# Patient Record
Sex: Male | Born: 1965 | Race: Black or African American | Hispanic: No | Marital: Single | Smoking: Current every day smoker
Health system: Southern US, Community
[De-identification: ages and names within clinical notes are randomized; demographics above are authoritative.]

## PROBLEM LIST (undated history)

## (undated) DIAGNOSIS — M1990 Unspecified osteoarthritis, unspecified site: Secondary | ICD-10-CM

## (undated) DIAGNOSIS — M545 Low back pain, unspecified: Secondary | ICD-10-CM

## (undated) HISTORY — DX: Low back pain, unspecified: M54.50

## (undated) HISTORY — PX: SPINE SURGERY: SHX786

## (undated) HISTORY — DX: Low back pain: M54.5

## (undated) HISTORY — PX: NECK SURGERY: SHX720

## (undated) HISTORY — DX: Unspecified osteoarthritis, unspecified site: M19.90

---

## 1998-05-30 ENCOUNTER — Ambulatory Visit (HOSPITAL_COMMUNITY): Admission: RE | Admit: 1998-05-30 | Discharge: 1998-05-30 | Payer: Self-pay | Admitting: Neurosurgery

## 1998-05-30 ENCOUNTER — Encounter: Payer: Self-pay | Admitting: Neurosurgery

## 1998-06-03 ENCOUNTER — Ambulatory Visit (HOSPITAL_COMMUNITY): Admission: RE | Admit: 1998-06-03 | Discharge: 1998-06-03 | Payer: Self-pay | Admitting: Neurosurgery

## 1998-06-03 ENCOUNTER — Encounter: Payer: Self-pay | Admitting: Neurosurgery

## 1998-06-22 ENCOUNTER — Encounter: Payer: Self-pay | Admitting: Neurosurgery

## 1998-06-22 ENCOUNTER — Ambulatory Visit (HOSPITAL_COMMUNITY): Admission: RE | Admit: 1998-06-22 | Discharge: 1998-06-23 | Payer: Self-pay | Admitting: Neurosurgery

## 1998-07-14 ENCOUNTER — Ambulatory Visit (HOSPITAL_COMMUNITY): Admission: RE | Admit: 1998-07-14 | Discharge: 1998-07-14 | Payer: Self-pay | Admitting: Neurosurgery

## 1998-07-14 ENCOUNTER — Encounter: Payer: Self-pay | Admitting: Neurosurgery

## 1998-08-04 ENCOUNTER — Ambulatory Visit (HOSPITAL_COMMUNITY): Admission: RE | Admit: 1998-08-04 | Discharge: 1998-08-04 | Payer: Self-pay | Admitting: Neurosurgery

## 1998-08-04 ENCOUNTER — Encounter: Payer: Self-pay | Admitting: Neurosurgery

## 1998-08-21 ENCOUNTER — Encounter: Payer: Self-pay | Admitting: Neurosurgery

## 1998-08-21 ENCOUNTER — Ambulatory Visit (HOSPITAL_COMMUNITY): Admission: RE | Admit: 1998-08-21 | Discharge: 1998-08-21 | Payer: Self-pay | Admitting: Neurosurgery

## 1999-08-02 ENCOUNTER — Encounter: Payer: Self-pay | Admitting: Neurosurgery

## 1999-08-02 ENCOUNTER — Encounter: Admission: RE | Admit: 1999-08-02 | Discharge: 1999-08-02 | Payer: Self-pay | Admitting: Neurosurgery

## 1999-11-07 ENCOUNTER — Encounter: Payer: Self-pay | Admitting: Neurosurgery

## 1999-11-07 ENCOUNTER — Inpatient Hospital Stay (HOSPITAL_COMMUNITY): Admission: RE | Admit: 1999-11-07 | Discharge: 1999-11-08 | Payer: Self-pay | Admitting: Neurosurgery

## 1999-11-16 ENCOUNTER — Encounter: Payer: Self-pay | Admitting: Neurosurgery

## 1999-11-16 ENCOUNTER — Encounter: Admission: RE | Admit: 1999-11-16 | Discharge: 1999-11-16 | Payer: Self-pay | Admitting: Neurosurgery

## 2000-01-03 ENCOUNTER — Encounter: Payer: Self-pay | Admitting: Neurosurgery

## 2000-01-03 ENCOUNTER — Encounter: Admission: RE | Admit: 2000-01-03 | Discharge: 2000-01-03 | Payer: Self-pay | Admitting: Neurosurgery

## 2000-02-21 ENCOUNTER — Encounter: Payer: Self-pay | Admitting: Neurosurgery

## 2000-02-21 ENCOUNTER — Encounter: Admission: RE | Admit: 2000-02-21 | Discharge: 2000-02-21 | Payer: Self-pay | Admitting: Neurosurgery

## 2000-05-15 ENCOUNTER — Encounter: Admission: RE | Admit: 2000-05-15 | Discharge: 2000-08-13 | Payer: Self-pay | Admitting: Neurosurgery

## 2000-06-24 ENCOUNTER — Encounter: Payer: Self-pay | Admitting: Neurosurgery

## 2000-06-24 ENCOUNTER — Encounter: Admission: RE | Admit: 2000-06-24 | Discharge: 2000-06-24 | Payer: Self-pay | Admitting: Neurosurgery

## 2001-12-07 ENCOUNTER — Ambulatory Visit (HOSPITAL_COMMUNITY): Admission: RE | Admit: 2001-12-07 | Discharge: 2001-12-07 | Payer: Self-pay | Admitting: Neurosurgery

## 2001-12-07 ENCOUNTER — Encounter: Payer: Self-pay | Admitting: Neurosurgery

## 2009-12-15 ENCOUNTER — Inpatient Hospital Stay (HOSPITAL_COMMUNITY): Admission: RE | Admit: 2009-12-15 | Discharge: 2009-12-18 | Payer: Self-pay | Admitting: *Deleted

## 2010-03-28 LAB — COMPREHENSIVE METABOLIC PANEL
ALT: 20 U/L (ref 0–53)
AST: 23 U/L (ref 0–37)
Albumin: 4 g/dL (ref 3.5–5.2)
CO2: 26 mEq/L (ref 19–32)
Calcium: 9.5 mg/dL (ref 8.4–10.5)
Chloride: 109 mEq/L (ref 96–112)
Creatinine, Ser: 1.02 mg/dL (ref 0.4–1.5)
GFR calc Af Amer: >60 mL/min (ref >60)
GFR calc non Af Amer: >60 mL/min (ref >60)
Sodium: 139 mEq/L (ref 135–145)
Total Bilirubin: 0.4 mg/dL (ref 0.3–1.2)

## 2010-03-28 LAB — CBC
Hemoglobin: 17.2 g/dL — ABNORMAL HIGH (ref 13.0–17.0)
Platelets: 190 10*3/uL (ref 150–400)
RBC: 5.2 MIL/uL (ref 4.22–5.81)

## 2010-03-28 LAB — URINALYSIS, ROUTINE W REFLEX MICROSCOPIC
Bilirubin Urine: NEGATIVE
Ketones, ur: NEGATIVE mg/dL
Nitrite: NEGATIVE
Protein, ur: NEGATIVE mg/dL
Urobilinogen, UA: 0.2 mg/dL (ref 0.0–1.0)

## 2010-03-28 LAB — PROTIME-INR: Prothrombin Time: 11.8 seconds (ref 11.6–15.2)

## 2010-03-28 LAB — DIFFERENTIAL
Eosinophils Absolute: 0.1 10*3/uL (ref 0.0–0.7)
Eosinophils Relative: 1 % (ref 0–5)
Lymphocytes Relative: 42 % (ref 12–46)
Lymphs Abs: 2.3 10*3/uL (ref 0.7–4.0)
Monocytes Absolute: 0.5 10*3/uL (ref 0.1–1.0)

## 2010-03-28 LAB — TYPE AND SCREEN: Antibody Screen: NEGATIVE

## 2010-03-28 LAB — SURGICAL PCR SCREEN
MRSA, PCR: NEGATIVE
Staphylococcus aureus: POSITIVE — AB

## 2010-05-05 ENCOUNTER — Other Ambulatory Visit (HOSPITAL_COMMUNITY): Payer: Self-pay | Admitting: Orthopedic Surgery

## 2010-05-05 DIAGNOSIS — S32050A Wedge compression fracture of fifth lumbar vertebra, initial encounter for closed fracture: Secondary | ICD-10-CM

## 2010-05-12 ENCOUNTER — Other Ambulatory Visit (HOSPITAL_COMMUNITY): Payer: Self-pay

## 2010-05-23 ENCOUNTER — Ambulatory Visit (HOSPITAL_COMMUNITY)
Admission: RE | Admit: 2010-05-23 | Discharge: 2010-05-23 | Disposition: A | Discharge status: Home / Self Care | Payer: Worker's Compensation | Source: Ambulatory Visit | Attending: Orthopedic Surgery | Admitting: Orthopedic Surgery

## 2010-05-23 DIAGNOSIS — S32050A Wedge compression fracture of fifth lumbar vertebra, initial encounter for closed fracture: Secondary | ICD-10-CM

## 2010-05-23 DIAGNOSIS — M5137 Other intervertebral disc degeneration, lumbosacral region: Secondary | ICD-10-CM | POA: Insufficient documentation

## 2010-05-23 DIAGNOSIS — M79609 Pain in unspecified limb: Secondary | ICD-10-CM | POA: Insufficient documentation

## 2010-05-23 DIAGNOSIS — M51379 Other intervertebral disc degeneration, lumbosacral region without mention of lumbar back pain or lower extremity pain: Secondary | ICD-10-CM | POA: Insufficient documentation

## 2010-05-23 DIAGNOSIS — M519 Unspecified thoracic, thoracolumbar and lumbosacral intervertebral disc disorder: Secondary | ICD-10-CM | POA: Insufficient documentation

## 2010-05-23 DIAGNOSIS — M8448XA Pathological fracture, other site, initial encounter for fracture: Secondary | ICD-10-CM | POA: Insufficient documentation

## 2010-05-23 DIAGNOSIS — M549 Dorsalgia, unspecified: Secondary | ICD-10-CM | POA: Insufficient documentation

## 2010-05-23 MED ORDER — IOHEXOL 180 MG/ML  SOLN
20.0000 mL | Freq: Once | INTRAMUSCULAR | Status: AC | PRN
  Administered 2010-05-23: 20 mL via INTRATHECAL

## 2010-06-02 NOTE — Op Note (Signed)
[place]. [facility]  Patient:    Gabriel Bates, Gabriel Bates                          MRN: [id number] Proc. Date: 11/07/99 Adm. 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  OPERATION PERFORMED:  Partial C6 corpectomy, removal of pseudoarthrosis, decompression of the spinal cord as well as the nerve roots.  Insertion of iliac crest bone bank graft 11 mm height.  Synthes plates.  SURGEON:  Tanya Nones. Jeral Fruit, M.D.  MICROSCOPE:  Midas Rex.  ASSISTANT:  Hewitt Shorts, M.D.  ANESTHESIA:  INDICATIONS FOR PROCEDURE:  Gabriel Bates is a 45 year old gentleman who is a heavy smoker who underwent anterior cervical fusion by another neurosurgeon about more than a year ago.  The patient continues to have neck pain.  He was followed by several physicians who found out that indeed he has pseudoarthrosis between 5 and 6.  Surgery was advised.  At the beginning, we were planning to go ahead the same side as the incision but because this one seemed to be a bit more  ____________  we decided to go ahead from the opposite side.  He had surgery on the right side before.  Nevertheless during the intubation we were able to see that the patients vocal cords were in the midline.  The patient knew of the risks such as damage to vocal cord, ____________ neck pain, need for further surgery  ____________ .  DESCRIPTION OF PROCEDURE:  The patient was taken to the operating room and after intubation we showed good normal vocal cords, the left side of the neck was prepped with Betadine.  Transverse incision through the skin, platysma was carried out.  We finally were able to ____________ scar tissue along the anterior part of the spine.  X-ray showed that we were at the level of 4-5. From then on, we were looking at the area where the bone graft  was. ____________ 5-6 and 6-7 and then we went into the middle.  We took another x-ray.  Using the Midas Rex drill as well as the microscope with microdissection removing the previous fusion.  What we found was mostly fibrosis.  Then with drilling out on the C5 body, we had to drill at least 2 mm before we were able to see normal bone.  At the level of C6, we had to drill close to half of the bone to find normal bone.  ____________ sclerotic and we were worried that putting a bone graft here would not take.  At the end we found that he has also posterior ligament mostly on the left side which was removed.  We had plenty of room for the spinal cord as well as the nerve root. Then ____________ iliac crest of 11 mm height was inserted.  We brought up a Synthes plate and the first one ____________ that indeed although the two superior screws in right position, the lower one was not.  We had to change the plate from the 22 to a 20.  Finally we had good ____________ screws with the locking screws. The x-ray showed good position of the bone graft.  From then on the area was irrigated and closed with Vicryl  and Steri-Strips.  The patient did well. DD:  11/07/99 TD:  11/07/99 Job: [id number]

## 2010-06-02 NOTE — H&P (Signed)
[place]. [facility]  Patient:    Gabriel Bates, Gabriel Bates                          MRN: [id number] Adm. Date:  [id number] Attending:  Danella Penton                         History and Physical  HISTORY:  Mr. Danis is a gentleman who was sent to [place] because of persistent neck pain.  This gentleman in October 1999, while lifting some sheet rock, and someone put behind him a bucket, and he did not realize.  He went back and fell.  The patient was seen by a medical physician, and later on a neurosurgeon here in town who proceeded with an anterior cervical diskectomy with bone graft.  Nevertheless, he feels that the pain is worse after surgery. He is not any better.  He was seen by an orthopedic surgeon who found that he has a collapse of the bone graft with pseudoarthrosis.  A rehabilitation physician sent him for evaluation.  I saw him initially about four months ago and talked to him about what to do next.  The patient was a little reluctant, but now he called the office, and he wants to proceed with surgery.  PAST MEDICAL HISTORY:  Anterior cervical diskectomy.  ALLERGIES:  TAGAMET.  SOCIAL HISTORY:  The patient smokes one pack a day, and he has been doing this for almost one year.  The patient does not drink.  He is 6 feet, and he weighs 143 pounds.  FAMILY HISTORY:  Unremarkable.  PHYSICAL EXAMINATION:  HEENT:  Normal.  NECK:  A scar on the left side.  He is able to flex, but extension and lateralization produced pain going into the shoulder.  LUNGS:  Clear.  HEART:  Sounds normal.  ABDOMEN:  Normal.  EXTREMITIES:  Normal pulses.  NEUROLOGIC:  Mental status:  Normal.  Cranial nerves normal.  Strength 5/5 in the upper and lower extremities.  Sensation normal.  Reflexes 2+, no Babinski. Coordination and gait normal.  The patient has a myelogram which showed that indeed he has a pseudoarthrosis between C5-6.  The EMG and nerve conduction velocity  were unremarkable.  IMPRESSION:  Chronic neck pain, status post anterior C5-6 diskectomy with pseudoarthrosis.  RECOMMENDATIONS:  The patient is being admitted for surgery.  We are going to revise the fusion and probable refusion using a Synthes plate with the Midas Rex. He knows about the risks such as paralysis, no improvement whatsoever, damage to the vocal cords, damage to the esophagus, damage to the trachea, stroke, or weakness.  He knows that it will be easy for [place] to do his surgery on the right side, but we decided to go from the side where he had the surgery before, to prevent the possibility of risking any vascular stroke to the right side.  DD:  11/07/99 TD:  11/07/99 Job: [id number]

## 2010-06-02 NOTE — Discharge Summary (Signed)
[place]. [facility]  Patient:    Gabriel Bates, Gabriel Bates                          MRN: [id number] Adm. Date:  [id number] Disc. Date: [id number] Attending:  Danella Penton                           Discharge Summary  ADMISSION DIAGNOSIS:  Pseudarthrosis C5-6.  DISCHARGE DIAGNOSIS:  Pseudarthrosis C5-6.  CLINICAL HISTORY:  The patient was admitted because of neck pain.  This gentleman had surgery done a year ago by a neurosurgeon and unfortunately the fusion did not take.  The patient is a heavy smoker.  The patient has had _______________________.  HOSPITAL COURSE:  The patient was taken to surgery yesterday and removal of the pseudarthrosis as well as partial carpectomy of C6 was done. This was followed by fusion.  Today the patient is doing great.  He has minimal complaints and wants to go home.  CONDITION ON DISCHARGE: Improved.  MEDICATIONS:  Vicodin and Diazepam.  DIET:  Regular.  ACTIVITY:  He is not to drive for at least a week and not to do any heavy lifting.  FOLLOW-UP:  He is to be seen by me in two weeks. DD:  11/08/99 TD:  11/08/99 Job: [id number]

## 2011-03-26 ENCOUNTER — Encounter
Payer: Worker's Compensation | Attending: Physical Medicine & Rehabilitation | Admitting: Physical Medicine & Rehabilitation

## 2011-03-26 ENCOUNTER — Encounter: Payer: Self-pay | Admitting: Physical Medicine & Rehabilitation

## 2011-03-26 DIAGNOSIS — M545 Low back pain, unspecified: Secondary | ICD-10-CM | POA: Insufficient documentation

## 2011-03-26 DIAGNOSIS — IMO0002 Reserved for concepts with insufficient information to code with codable children: Secondary | ICD-10-CM

## 2011-03-26 DIAGNOSIS — M5416 Radiculopathy, lumbar region: Secondary | ICD-10-CM

## 2011-03-26 DIAGNOSIS — M216X9 Other acquired deformities of unspecified foot: Secondary | ICD-10-CM | POA: Insufficient documentation

## 2011-03-26 DIAGNOSIS — M461 Sacroiliitis, not elsewhere classified: Secondary | ICD-10-CM

## 2011-03-26 DIAGNOSIS — M5030 Other cervical disc degeneration, unspecified cervical region: Secondary | ICD-10-CM | POA: Insufficient documentation

## 2011-03-26 DIAGNOSIS — M79609 Pain in unspecified limb: Secondary | ICD-10-CM | POA: Insufficient documentation

## 2011-03-26 DIAGNOSIS — Z79899 Other long term (current) drug therapy: Secondary | ICD-10-CM | POA: Insufficient documentation

## 2011-03-26 DIAGNOSIS — M961 Postlaminectomy syndrome, not elsewhere classified: Secondary | ICD-10-CM | POA: Insufficient documentation

## 2011-03-26 LAB — POCT URINE DRUG SCREEN

## 2011-03-26 MED ORDER — CYCLOBENZAPRINE HCL 10 MG PO TABS: 10.0000 mg | ORAL_TABLET | Freq: Three times a day (TID) | ORAL | Status: AC

## 2011-03-26 MED ORDER — MELOXICAM 7.5 MG PO TABS: 7.5000 mg | ORAL_TABLET | Freq: Every day | ORAL | Status: DC

## 2011-03-26 MED ORDER — GABAPENTIN 300 MG PO CAPS: 600.0000 mg | ORAL_CAPSULE | Freq: Three times a day (TID) | ORAL | Status: DC

## 2011-03-26 NOTE — Patient Instructions (Signed)
Continue some ROM as tolerated.  Utilized heat before exercises

## 2011-03-26 NOTE — Progress Notes (Signed)
Subjective:    Patient ID: Gabriel Bates, male    DOB: 10/12/1965, 46 y.o.   MRN: [id number]  HPI  Pt is here in regards to low back and left leg pain.  Initially, he injured his back in April 2011 lifting bags of mulch and dirt while helping his boss at the work site. He felt like he had a pulled muscle in his back with spasms, and he noticed severe pain down the left leg all the way into the foot. Problems with urination also.  He ultimately underwent a L5-S1 laminectomy,TLIF on 12/15/2009 by Dr. Yevette Edwards which helped back pain substantial. He continues to have left leg pain as well as numbness and weakness in his left foot. He describes the pain as "pins and needles".  He has to sleep with a pillow between his legs.  He wakes up every hour or two at night it seems.   He had land-based and aquatic PT after surgery, and it tended to exacerbate his pain more than anything else, so it was stopped after 3 or 4 months.   Pt developed left buttock/low back pain.  He underwent 2 left SIJ blocks and a left SI RF on 09/28/2010 which provided little to no relief per patient.  Patient is currently on flexeril, gabapentin, hydrocodone, and lidoderm for his pain.  He finds these don't help very much either.  The flexeril provides perhaps a little more relief than the others.  The more he moves, the more the back and leg tend to be aggravated.  He finds that any type of prolonged activities or positions exacerbate the pain. It even hurts simply to stand on the leg.   He found that ice may benefit him temporarily.  He has a TENS unit which helps the pain fot about an hour.  He uses the TENS unit usually once a day.    He also has a history of cervical fusion about 7 or 8 years ago. He feels that the back problems are beginning to affect his neck and left shoulder as well now.  He worked previously for [employer] and was allowed light physical duty by an Administrator, arts at the end of last year, however this level of physical  activity cannot be accomodated by employer.  Patient is someone who was quite active prior to this injury. The fact that his physical activity is quite limited at this point and that his pain is so severe is now getting to him emotionally.  The only imaging I have available is a CT myelogram report dated 05/23/2010. It's noted at the L4-L5 level that he is mild disc degeneration and bulging. At L5-S1 with pedicle screw fixation bilaterally with interbody bone graft. This is negative for solid bony fusion. Bone graft is present the left of midline.   Pain Inventory Average Pain 7 Pain Right Now 6 My pain is sharp and aching  In the last 24 hours, has pain interfered with the following? General activity 8 Relation with others 5 Enjoyment of life 6 What TIME of day is your pain at its worst? Morning and Night Sleep (in general) Fair  Pain is worse with: walking, bending, sitting, standing and some activites Pain improves with: rest and heat/ice Relief from Meds: 5  Mobility use a cane how many minutes can you walk? 5 do you drive?  yes  Function Employed but not working at this time  Neuro/Psych weakness numbness tingling trouble walking  Prior Studies x-rays CT/MRI  Physicians  involved in your care Dr. Yevette Edwards  Review of Systems  HENT: Negative.   Eyes: Negative.   Respiratory: Negative.   Cardiovascular: Negative.   Gastrointestinal: Negative.   Genitourinary: Negative.   Musculoskeletal: Negative.   Skin: Negative.   Neurological: Positive for weakness and numbness.  Hematological: Negative.   Psychiatric/Behavioral: Negative.        Objective:   Physical Exam  Constitutional: He is oriented to person, place, and time. He appears well-developed and well-nourished.  HENT:  Head: Normocephalic and atraumatic.  Eyes: EOM are normal. Pupils are equal, round, and reactive to light.  Neck: Normal range of motion.  Cardiovascular: Normal rate.     Pulmonary/Chest: Effort normal.  Abdominal: Soft.  Musculoskeletal: Normal range of motion.       Patient displays substantial pain with palpation over the L4/L5 spine segments on the left. He has substantial muscle spasm in the paraspinals at this level as well. He had pain with bowel movements today but most prominently with lumbar flexion. He could not and go beyond about 40 of flexion before pain settled in. He couldn't range laterally to about 20 and extend to about 15 today.  The patient had significant antalgia with weightbearing on the left side. Most the pain was in the low back with weightbearing. Straight leg testing was positive on the left and crossed  straight leg testing was also positive. Patrick's testing was equivocal as was compression testing. Her trochanters are nontender. Posture was poor due to his severe pain on the left low back.  Neurological: He is alert and oriented to person, place, and time. No cranial nerve deficit.  Reflex Scores:      Tricep reflexes are 2+ on the right side and 2+ on the left side.      Bicep reflexes are 2+ on the right side and 2+ on the left side.      Brachioradialis reflexes are 2+ on the right side and 2+ on the left side.      Patellar reflexes are 2+ on the right side and 2+ on the left side.      Achilles reflexes are 2+ on the right side and 1+ on the left side.      Patient with positive sensory deficits and L5 dermatome on the left. He had 2/5 tibialis anterior strength and 1/5 EHL strength. Knee extension was 3+ to 4/5. Hip abduction 4/5. Hip abduction is also 4/5. Hip flexion is 3 to 5:00 large pain in addition component. Ankle plantar flexion was 3+ to 4/5.  Skin: Skin is warm.  Psychiatric: He has a normal mood and affect. His speech is normal and behavior is normal. Judgment and thought content normal. Cognition and memory are normal.          Assessment & Plan:  1. Lumbar post laminectomy syndrome 2. Lumbar  radiculopathy L5 left with foot drop and substantial sensory deficits 3. 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 I'd like to give him a better trial of his anticonvulsant medication. We will increase his Neurontin ultimately up to 600 mg 3 times a day to start. 2. Increase Flexeril to 10 mg 3 times a day schedule. 3. Begin trial of Oxy 10 7.5 mg by mouth daily. I believe he has tried Celebrex in the past which elevated his blood pressure. We will need to watch blood pressure closely with tamoxifen can. I do think he needs a trial of this however. 4. Consider long-acting  opiate to improve the pain, however, he needs a reasonable trial of the above medications. I think a tricyclic antidepressant also might be helpful for sleep and radicular pain. 5. Could consider another epidural steroid injection perhaps above the surgical fusion site however there would be no GERD T. that this would be overly helpful. I do think this would be more useful than a potential followup SI radiofrequency ablation. 6. Ultimately, and most importantly, we need to reduce his pain levels so that he can pursue adequate exercise and stretching which will ultimately be the most important for him. Also this problem has affected him he emotionally and this will further become a problem with his pain is not improved soon. 7. We'll see the patient back in about one month's time. He was asked to call with any problems or questions.

## 2011-04-11 ENCOUNTER — Telehealth: Payer: Self-pay | Admitting: Physical Medicine & Rehabilitation

## 2011-04-11 NOTE — Telephone Encounter (Signed)
LM for pt to call back.

## 2011-04-11 NOTE — Telephone Encounter (Signed)
Needs someone to call about meds.

## 2011-04-11 NOTE — Telephone Encounter (Signed)
Pt spoke to April and wanted to move 04/27/11 appointment up but Dr. Riley Kill doesn't have anything sooner.

## 2011-04-16 ENCOUNTER — Telehealth: Payer: Self-pay | Admitting: *Deleted

## 2011-04-16 DIAGNOSIS — M5416 Radiculopathy, lumbar region: Secondary | ICD-10-CM

## 2011-04-16 DIAGNOSIS — M5030 Other cervical disc degeneration, unspecified cervical region: Secondary | ICD-10-CM

## 2011-04-16 DIAGNOSIS — M961 Postlaminectomy syndrome, not elsewhere classified: Secondary | ICD-10-CM

## 2011-04-16 DIAGNOSIS — M461 Sacroiliitis, not elsewhere classified: Secondary | ICD-10-CM

## 2011-04-16 MED ORDER — HYDROCODONE-ACETAMINOPHEN 10-325 MG PO TABS: 1.0000 | ORAL_TABLET | Freq: Four times a day (QID) | ORAL | Status: DC | PRN

## 2011-04-16 NOTE — Telephone Encounter (Signed)
Has called several times about medication and no one has bothered to call him back. He has explained in other messages what he needs. Upset that this is not handled before the weekend. Please call him back

## 2011-04-16 NOTE — Telephone Encounter (Signed)
Medication has been called in for pt to last him until his appointment.

## 2011-04-19 ENCOUNTER — Encounter: Payer: Self-pay | Admitting: Physical Medicine & Rehabilitation

## 2011-04-25 ENCOUNTER — Other Ambulatory Visit: Payer: Self-pay | Admitting: *Deleted

## 2011-04-25 DIAGNOSIS — M961 Postlaminectomy syndrome, not elsewhere classified: Secondary | ICD-10-CM

## 2011-04-25 DIAGNOSIS — M5416 Radiculopathy, lumbar region: Secondary | ICD-10-CM

## 2011-04-25 DIAGNOSIS — M5030 Other cervical disc degeneration, unspecified cervical region: Secondary | ICD-10-CM

## 2011-04-25 DIAGNOSIS — M461 Sacroiliitis, not elsewhere classified: Secondary | ICD-10-CM

## 2011-04-25 MED ORDER — CYCLOBENZAPRINE HCL 10 MG PO TABS: 10.0000 mg | ORAL_TABLET | Freq: Three times a day (TID) | ORAL | Status: DC | PRN

## 2011-04-25 MED ORDER — GABAPENTIN 300 MG PO CAPS: 600.0000 mg | ORAL_CAPSULE | Freq: Three times a day (TID) | ORAL | Status: DC

## 2011-04-26 MED ORDER — CYCLOBENZAPRINE HCL 10 MG PO TABS: 10.0000 mg | ORAL_TABLET | Freq: Three times a day (TID) | ORAL | Status: DC | PRN

## 2011-04-26 NOTE — Telephone Encounter (Signed)
New order placed for cyclobenzaprine on 04/26/11 no print-no eprescribe because of the 04/25/11 order was e- prescribed, but had an end date of 10 days later. This entry was made to remove the "end date"

## 2011-04-27 ENCOUNTER — Encounter
Payer: Worker's Compensation | Attending: Physical Medicine & Rehabilitation | Admitting: Physical Medicine & Rehabilitation

## 2011-04-27 ENCOUNTER — Encounter: Payer: Self-pay | Admitting: Physical Medicine & Rehabilitation

## 2011-04-27 VITALS — BP 123/82 | HR 60 | Ht 72.0 in | Wt 136.0 lb

## 2011-04-27 DIAGNOSIS — M5416 Radiculopathy, lumbar region: Secondary | ICD-10-CM

## 2011-04-27 DIAGNOSIS — M5030 Other cervical disc degeneration, unspecified cervical region: Secondary | ICD-10-CM | POA: Insufficient documentation

## 2011-04-27 DIAGNOSIS — M961 Postlaminectomy syndrome, not elsewhere classified: Secondary | ICD-10-CM | POA: Insufficient documentation

## 2011-04-27 DIAGNOSIS — IMO0002 Reserved for concepts with insufficient information to code with codable children: Secondary | ICD-10-CM | POA: Insufficient documentation

## 2011-04-27 DIAGNOSIS — M461 Sacroiliitis, not elsewhere classified: Secondary | ICD-10-CM | POA: Insufficient documentation

## 2011-04-27 MED ORDER — PREGABALIN 50 MG PO CAPS: 50.0000 mg | ORAL_CAPSULE | Freq: Three times a day (TID) | ORAL | Status: DC

## 2011-04-27 MED ORDER — MELOXICAM 7.5 MG PO TABS: 15.0000 mg | ORAL_TABLET | Freq: Every day | ORAL | Status: AC

## 2011-04-27 MED ORDER — OXYMORPHONE HCL ER 5 MG PO TB12: 5.0000 mg | ORAL_TABLET | Freq: Two times a day (BID) | ORAL | Status: AC

## 2011-04-27 MED ORDER — HYDROCODONE-ACETAMINOPHEN 10-325 MG PO TABS: 1.0000 | ORAL_TABLET | Freq: Four times a day (QID) | ORAL | Status: DC | PRN

## 2011-04-27 NOTE — Patient Instructions (Signed)
Start the lyrica (nerve pain medicine) in one week  Follow up with your surgeon

## 2011-04-27 NOTE — Progress Notes (Signed)
Subjective:    Patient ID: Gabriel Bates, male    DOB: [birth year], 46 y.o.   MRN: [id number]  HPI  Gabriel Bates is back regarding his low back and left leg pain. I saw him about a month ago. We initiated Neurontin for his neuropathic pain which he could not tolerate due to nausea and decreased appetite. He had to stop this. We called him in some hydrocodone which did help somewhat with his pain. Oxy 10 Mainzer burrowed with perhaps some benefit. His blood pressures remained stable. He is now followed further with his back surgeon. The plan will spoke with to pursue SI radiofrequency ablation. In reviewing his records again today, he has not had her image of his lumbar spine since last spring.  He feels overall that the pain is increasing. He has continued problems with foot drop on the left. He has a hard time bearing any weight on the left side.Marland Kitchen He is becoming increasingly depressed over the fact that he has lost his mobility and overall functionality. He was a very active person before he began to have problems with his back.  Pain Inventory Average Pain 7 Pain Right Now 7 My pain is sharp and aching  In the last 24 hours, has pain interfered with the following? General activity 1 Relation with others 1 Enjoyment of life 0 What TIME of day is your pain at its worst? All day Sleep (in general) Poor  Pain is worse with: walking, bending, sitting and standing Pain improves with: rest Relief from Meds: 5  Mobility use a cane  Function disabled: date disabled 08/15/2009  Neuro/Psych weakness trouble walking  Prior Studies Any changes since last visit?  no  Physicians involved in your care Any changes since last visit?  no        Review of Systems  HENT: Negative.   Eyes: Negative.   Respiratory: Negative.   Cardiovascular: Negative.   Gastrointestinal: Negative.   Genitourinary: Positive for dysuria.  Musculoskeletal: Negative.   Skin: Negative.   Neurological:  Positive for weakness.  Hematological: Negative.   Psychiatric/Behavioral: Negative.        Objective:   Physical Exam  Constitutional: He is oriented to person, place, and time. He appears well-developed and well-nourished.  HENT:  Head: Normocephalic and atraumatic.  Eyes: Conjunctivae and EOM are normal. Pupils are equal, round, and reactive to light.  Neck: Normal range of motion. Neck supple.  Cardiovascular: Normal rate and regular rhythm.   Pulmonary/Chest: Effort normal and breath sounds normal.  Abdominal: Soft. Bowel sounds are normal.  Musculoskeletal:       Right shoulder: Pain:  Straight leg raising was positive. He is very antalgic on the leg.       Lumbar back: He exhibits decreased range of motion, tenderness, bony tenderness, pain and spasm.       Patient remains extremely limited in range of motion at the lumbar spine. He can bend to about 20 in forward flexion and about 10 or 15 in other planes. He is frankly antalgic on the left leg with gait. He has difficulty standing or sitting for prolonged periods of time. He has pain with palpation over the left low lumbar spine down to the PSIS area.  Neurological: He is alert and oriented to person, place, and time.       His ankle dorsiflexion and plantar flexion weakness grossly 3/5. Hamstrings are 3+ out of 5. There is diminished sensation of the L5 and S1 distributions.  Reflexes are diminished at the left heel cord  Skin: Skin is warm.          Assessment & Plan:   1. Lumbar post laminectomy syndrome  2. Lumbar radiculopathy L5, (likely S1 involvement also) left with foot drop and substantial sensory deficits  3. 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 Lyrica 50mg  tid. We will start the Lyrica in about 7-10 days so as not to overlap with his narcotic medication changes 2. Can continue Flexeril to 10 mg 3 times a day scheduled.  3. Increase meloxicam to  15mg . His blood pressure has remained stable so far..  4. Begin opana ER 5mg  q12 to improve his baseline pain control. .  5 .I highly recommend follow up with his surgeon.  This patient has substantial neurological findings on examination. I feel that he needs a followup CT myelogram as well.  A second opinion also may be valuable here.  6. We'll see the patient back in about one month's time. He was asked to call with any problems or questions.

## 2011-05-22 ENCOUNTER — Other Ambulatory Visit: Payer: Self-pay | Admitting: *Deleted

## 2011-05-22 DIAGNOSIS — M5416 Radiculopathy, lumbar region: Secondary | ICD-10-CM

## 2011-05-22 DIAGNOSIS — M961 Postlaminectomy syndrome, not elsewhere classified: Secondary | ICD-10-CM

## 2011-05-22 DIAGNOSIS — M5030 Other cervical disc degeneration, unspecified cervical region: Secondary | ICD-10-CM

## 2011-05-22 DIAGNOSIS — M461 Sacroiliitis, not elsewhere classified: Secondary | ICD-10-CM

## 2011-05-22 MED ORDER — HYDROCODONE-ACETAMINOPHEN 10-325 MG PO TABS: 1.0000 | ORAL_TABLET | Freq: Four times a day (QID) | ORAL | Status: DC | PRN

## 2011-05-22 MED ORDER — CYCLOBENZAPRINE HCL 10 MG PO TABS: 10.0000 mg | ORAL_TABLET | Freq: Three times a day (TID) | ORAL | Status: DC | PRN

## 2011-05-22 MED ORDER — GABAPENTIN 300 MG PO CAPS: 600.0000 mg | ORAL_CAPSULE | Freq: Three times a day (TID) | ORAL | Status: DC

## 2011-05-23 ENCOUNTER — Telehealth: Payer: Self-pay | Admitting: Physical Medicine & Rehabilitation

## 2011-05-23 MED ORDER — OXYMORPHONE HCL ER 5 MG PO TB12
5.0000 mg | ORAL_TABLET | Freq: Two times a day (BID) | ORAL | Status: DC
Start: 2011-05-23 — End: 2011-06-12

## 2011-05-23 NOTE — Telephone Encounter (Signed)
Rx ready for pick up, pt aware 

## 2011-05-23 NOTE — Telephone Encounter (Signed)
Rx printed for Swartz to sign. 

## 2011-05-23 NOTE — Telephone Encounter (Signed)
Needs Opana refill

## 2011-06-12 ENCOUNTER — Encounter
Payer: Worker's Compensation | Attending: Physical Medicine & Rehabilitation | Admitting: Physical Medicine & Rehabilitation

## 2011-06-12 ENCOUNTER — Encounter: Payer: Self-pay | Admitting: Physical Medicine & Rehabilitation

## 2011-06-12 VITALS — BP 129/70 | HR 70 | Ht 71.0 in | Wt 136.0 lb

## 2011-06-12 DIAGNOSIS — M5416 Radiculopathy, lumbar region: Secondary | ICD-10-CM

## 2011-06-12 DIAGNOSIS — M461 Sacroiliitis, not elsewhere classified: Secondary | ICD-10-CM

## 2011-06-12 DIAGNOSIS — IMO0002 Reserved for concepts with insufficient information to code with codable children: Secondary | ICD-10-CM

## 2011-06-12 DIAGNOSIS — M961 Postlaminectomy syndrome, not elsewhere classified: Secondary | ICD-10-CM

## 2011-06-12 DIAGNOSIS — M5030 Other cervical disc degeneration, unspecified cervical region: Secondary | ICD-10-CM

## 2011-06-12 MED ORDER — MORPHINE SULFATE ER 15 MG PO TBCR: 15.0000 mg | EXTENDED_RELEASE_TABLET | Freq: Two times a day (BID) | ORAL | Status: DC

## 2011-06-12 MED ORDER — PREGABALIN 100 MG PO CAPS: 100.0000 mg | ORAL_CAPSULE | Freq: Two times a day (BID) | ORAL | Status: DC

## 2011-06-12 MED ORDER — HYDROCODONE-ACETAMINOPHEN 10-325 MG PO TABS: 1.0000 | ORAL_TABLET | Freq: Four times a day (QID) | ORAL | Status: DC | PRN

## 2011-06-12 NOTE — Progress Notes (Signed)
Subjective:    Patient ID: Gabriel Bates, male    DOB: 1965-02-12, 46 y.o.   MRN: [id number]  HPI  Gabriel Bates is back regarding his back pain. He tolerated the lyrica but didn't notice much difference with it. His opana pills are diffciult to swallow, and he wonders if there is another option. He has followed up with surgery, and he tells me that the RF was offered again.  No other recommendations were made per the patient.  Sleep remains a big problem due to his pain. He is unable to find a position which is comfortable.  He has problems bearing weight on the leg due to pain. The hydrocodone does seem to help him a bit.   Pain Inventory Average Pain 6 Pain Right Now 6 My pain is sharp and aching  In the last 24 hours, has pain interfered with the following? General activity 7 Relation with others 8 Enjoyment of life 8 What TIME of day is your pain at its worst? morning and night Sleep (in general) Poor  Pain is worse with: walking, bending, sitting and standing Pain improves with: rest and medication Relief from Meds: 5  Mobility walk without assistance use a cane how many minutes can you walk? 10 min do you drive?  yes  Function not employed: date last employed   Neuro/Psych weakness numbness  Prior Studies Any changes since last visit?  no  Physicians involved in your care Any changes since last visit?  no   Family History  Problem Relation Age of Onset  . Diabetes Mother   . Heart disease Mother   . Hypertension Mother   . Cancer Father    History   Social History  . Marital Status: Single    Spouse Name: N/A    Number of Children: N/A  . Years of Education: N/A   Social History Main Topics  . Smoking status: Current Everyday Smoker -- 1.0 packs/day    Types: Cigarettes  . Smokeless tobacco: None  . Alcohol Use: None  . Drug Use: None  . Sexually Active: None   Other Topics Concern  . None   Social History Narrative  . None   Past Surgical  History  Procedure Date  . Spine surgery   . Neck surgery    Past Medical History  Diagnosis Date  . Low back pain   . Arthritis    There were no vitals taken for this visit.      Review of Systems  All other systems reviewed and are negative.       Objective:   Physical Exam  Constitutional: He is oriented to person, place, and time. He appears well-developed and well-nourished.  HENT:  Head: Normocephalic and atraumatic.  Nose: Nose normal.  Mouth/Throat: Oropharynx is clear and moist.  Eyes: Conjunctivae and EOM are normal. Pupils are equal, round, and reactive to light.  Neck: Normal range of motion.  Cardiovascular: Normal rate and regular rhythm.   Pulmonary/Chest: Effort normal and breath sounds normal. No respiratory distress. He has no wheezes.  Abdominal: Soft.  Musculoskeletal:       Lumbar back: He exhibits decreased range of motion, tenderness, bony tenderness, pain and spasm.       Pain with palpation along the left PSIS. Poor posture due to back pain. He can bend to almost 45 degrees only.  He's antalgic on the left leg with gait or standing  Neurological: He is alert and oriented to person, place, and  time. No cranial nerve deficit.       Left LE: KE 4+, KF 3, ADF 2+, APF 3, EHL trace.   DTR's absent left ankle, 1-2+ left knee.   Pain inhibition weakness throughout the left leg.   Sensation decreased along the L5 and S1 dermatomes  Psychiatric: He has a normal mood and affect. His behavior is normal. Judgment and thought content normal.          Assessment & Plan:   1. Lumbar post laminectomy syndrome  2. Lumbar radiculopathy L5, (likely S1 involvement also) left with foot drop and substantial sensory deficits  3. Questionable SI joint dysfunction on the left  Plan:  1 .Increased lyrica to 100mg  tid for radicular pain.  2. Can continue Flexeril to 10 mg 3 times a day scheduled.  3. Continue meloxicam.  4. Replace opana with ms contin 15mg   q12 hrs and we'll see if he is able to swallow this better. He'll use hydrocodone for breakthrough pain.  5  This patient continues to display substantial neurological findings on examination. I feel that he needs a followup CT myelogram as well. A second opinion would be  valuable here.  6. We'll see the patient back in about one month's time. He was asked to call with any problems or questions

## 2011-06-12 NOTE — Patient Instructions (Signed)
Continue activity to tolerance! 

## 2011-07-13 ENCOUNTER — Encounter
Payer: Worker's Compensation | Attending: Physical Medicine & Rehabilitation | Admitting: Physical Medicine & Rehabilitation

## 2011-07-13 ENCOUNTER — Encounter: Payer: Self-pay | Admitting: Physical Medicine & Rehabilitation

## 2011-07-13 VITALS — BP 113/74 | HR 70 | Resp 14 | Ht 71.0 in | Wt 137.0 lb

## 2011-07-13 DIAGNOSIS — M461 Sacroiliitis, not elsewhere classified: Secondary | ICD-10-CM

## 2011-07-13 DIAGNOSIS — M961 Postlaminectomy syndrome, not elsewhere classified: Secondary | ICD-10-CM

## 2011-07-13 DIAGNOSIS — M5030 Other cervical disc degeneration, unspecified cervical region: Secondary | ICD-10-CM

## 2011-07-13 DIAGNOSIS — IMO0002 Reserved for concepts with insufficient information to code with codable children: Secondary | ICD-10-CM

## 2011-07-13 DIAGNOSIS — M5416 Radiculopathy, lumbar region: Secondary | ICD-10-CM

## 2011-07-13 MED ORDER — OXYMORPHONE HCL ER 10 MG PO TB12: 10.0000 mg | ORAL_TABLET | Freq: Two times a day (BID) | ORAL | Status: DC

## 2011-07-13 MED ORDER — HYDROCODONE-ACETAMINOPHEN 10-325 MG PO TABS: 1.0000 | ORAL_TABLET | Freq: Four times a day (QID) | ORAL | Status: DC | PRN

## 2011-07-13 MED ORDER — PREGABALIN 100 MG PO CAPS: 100.0000 mg | ORAL_CAPSULE | Freq: Four times a day (QID) | ORAL | Status: AC

## 2011-07-13 NOTE — Patient Instructions (Signed)
WORK ON REGULAR STRETCHING AND RANGE OF MOTION EVERY DAY. DON'T FORGET TO USE HEAT TO LOOSEN YOUR BACK MUSCLES.

## 2011-07-13 NOTE — Progress Notes (Signed)
Subjective:    Patient ID: Gabriel Bates, male    DOB: 06/10/1965, 46 y.o.   MRN: [id number]  HPI  Gabriel Bates is back regarding his chronic low back and left leg pain. He doesn't feel that he's had much change since I last saw him. He continues to have pain in the lower back around the left hip and into the left leg. His left foot and ankle feel numb. He has had flareups of his back is "locked up". When this does happen he has had to lay down and be very sedentary for one to 2 days at times. He said no to trouble tolerating the medications. He is moving his bowels.  Pain Inventory Average Pain 6 Pain Right Now 4 My pain is sharp, burning and aching  In the last 24 hours, has pain interfered with the following? General activity 6 Relation with others 4 Enjoyment of life 7 What TIME of day is your pain at its worst? Morning and Night Sleep (in general) Poor  Pain is worse with: walking, bending, sitting and standing Pain improves with: rest and medication Relief from Meds: 6  Mobility use a cane how many minutes can you walk? 10 ability to climb steps?  yes do you drive?  yes  Function Do you have any goals in this area?  no  Neuro/Psych weakness numbness tingling trouble walking  Prior Studies Any changes since last visit?  no  Physicians involved in your care Any changes since last visit?  no   Family History  Problem Relation Age of Onset  . Diabetes Mother   . Heart disease Mother   . Hypertension Mother   . Cancer Father    History   Social History  . Marital Status: Single    Spouse Name: N/A    Number of Children: N/A  . Years of Education: N/A   Social History Main Topics  . Smoking status: Current Everyday Smoker -- 1.0 packs/day    Types: Cigarettes  . Smokeless tobacco: None  . Alcohol Use: None  . Drug Use: None  . Sexually Active: None   Other Topics Concern  . None   Social History Narrative  . None   Past Surgical History    Procedure Date  . Spine surgery   . Neck surgery    Past Medical History  Diagnosis Date  . Low back pain   . Arthritis    BP 113/74  Pulse 70  Resp 14  Ht 5\' 11"  (1.803 m)  Wt 137 lb (62.143 kg)  BMI 19.11 kg/m2  SpO2 99%      Review of Systems  HENT: Negative.   Eyes: Negative.   Respiratory: Negative.   Cardiovascular: Negative.   Gastrointestinal: Negative.   Genitourinary: Negative.   Musculoskeletal: Positive for back pain and gait problem.  Skin: Negative.   Neurological: Positive for weakness and numbness.       Tingling  Hematological: Negative.   Psychiatric/Behavioral: Negative.        Objective:   Physical Exam Constitutional: He is oriented to person, place, and time. He appears well-developed and well-nourished.  HENT:  Head: Normocephalic and atraumatic.  Eyes: Conjunctivae and EOM are normal. Pupils are equal, round, and reactive to light.  Neck: Normal range of motion. Neck supple.  Cardiovascular: Normal rate and regular rhythm.  Pulmonary/Chest: Effort normal and breath sounds normal.  Abdominal: Soft. Bowel sounds are normal.  Musculoskeletal:  Right shoulder: Pain: Straight leg raising  was positive. He is very antalgic on the leg. (Perhaps not quite as much as before) Lumbar back: He exhibits decreased range of motion, tenderness, bony tenderness, pain and spasm.  Patient remains extremely limited in range of motion at the lumbar spine. He can bend to about 70-80 in forward flexion and about 10 or 15 in other planes. He is frankly antalgic on the left leg with gait. He has difficulty standing or sitting for prolonged periods of time. He has pain with palpation over the left low lumbar spine down to the PSIS area. Greater trochanter region slightly tender.  Neurological: He is alert and oriented to person, place, and time.  His ankle dorsiflexion and plantar flexion weakness grossly 3/5. Hamstrings are 3+ out of 5. There is diminished  sensation of the L5 and S1 distributions. Reflexes are diminished at the left heel cord  Skin: Skin is warm.  Assessment & Plan:    1. Lumbar post laminectomy syndrome  2. Lumbar radiculopathy L5, (likely S1 involvement also) left with foot drop and substantial sensory deficits  3. 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   Overall I think his symptoms appear a bit improved althought he is still having substantial left leg and back pain.   Plan:  1. Increase lyrica to 100mg  tid.  2. Can continue Flexeril to 10 mg 3 times a day scheduled.  3. Increase meloxicam to 15mg . His blood pressure has remained stable so far..  4. Increase opana ER to 10mg  q12 to improve his baseline pain control. .  5 .I still highly recommend follow up with his surgeon. This patient has substantial neurological findings on examination. I still feel that he needs a followup CT myelogram as well. A second opinion also may be valuable here.  6. We'll see the patient back in about one month's time. He was asked to call with any problems or questions.

## 2011-08-10 ENCOUNTER — Encounter: Payer: Worker's Compensation | Admitting: Physical Medicine & Rehabilitation

## 2013-04-03 ENCOUNTER — Encounter: Payer: Self-pay | Attending: Physical Medicine & Rehabilitation | Admitting: Physical Medicine & Rehabilitation

## 2013-04-03 ENCOUNTER — Encounter: Payer: Self-pay | Admitting: Physical Medicine & Rehabilitation

## 2013-04-03 VITALS — BP 145/82 | HR 82 | Resp 14 | Ht 71.0 in | Wt 148.0 lb

## 2013-04-03 DIAGNOSIS — IMO0002 Reserved for concepts with insufficient information to code with codable children: Secondary | ICD-10-CM | POA: Insufficient documentation

## 2013-04-03 DIAGNOSIS — M5030 Other cervical disc degeneration, unspecified cervical region: Secondary | ICD-10-CM

## 2013-04-03 DIAGNOSIS — M5416 Radiculopathy, lumbar region: Secondary | ICD-10-CM

## 2013-04-03 DIAGNOSIS — M961 Postlaminectomy syndrome, not elsewhere classified: Secondary | ICD-10-CM

## 2013-04-03 DIAGNOSIS — F17200 Nicotine dependence, unspecified, uncomplicated: Secondary | ICD-10-CM | POA: Insufficient documentation

## 2013-04-03 MED ORDER — CYCLOBENZAPRINE HCL 10 MG PO TABS
10.0000 mg | ORAL_TABLET | Freq: Three times a day (TID) | ORAL | Status: AC | PRN
Start: 2013-04-03 — End: ?

## 2013-04-03 MED ORDER — GABAPENTIN 300 MG PO CAPS: 300.0000 mg | ORAL_CAPSULE | Freq: Three times a day (TID) | ORAL | Status: DC

## 2013-04-03 NOTE — Addendum Note (Signed)
Addended by: Sherre PootWALSTON, Jay Haskew N on: 04/03/2013 01:26 PM   Modules accepted: Orders

## 2013-04-03 NOTE — Patient Instructions (Signed)
BEGIN GABAPENTIN: 300MG  AT NIGHT FOR 3 DAYS, THEN 300MG  TWICE DAILY FOR 3 DAYS THEN 300MG  THREE X A DAY THEREAFTER.  WE CAN BEGIN NARCOTIC PRESCRIPTIONS AFTER YOUR LABS COME BACK CONSISTENT.  PLEASE CALL ME WITH ANY PROBLEMS OR QUESTIONS [phone]).

## 2013-04-03 NOTE — Progress Notes (Signed)
Subjective:    Patient ID: Gabriel Bates, male    DOB: September 10, 1965, 48 y.o.   MRN: [id number]  HPI  Gabriel Bates is back regarding pain in his back and radiculopathy. His pain had improved with our medication regimen. He stopped coming and gradually came off all of his medications. His workman's comp had stopped paying for his visits.   He lost his job because of his pain and restrictions.   His pain remains in his low back and radiating down the left leg.  He fell recently on the ice which he feels exacerbated his symptoms.  Gabriel Bates is getting little rest. His activity is minimal.   His last CT myelogram  dated 05/23/2010. It's noted at the L4-L5 level that he is mild disc degeneration and bulging. At L5-S1 with pedicle screw fixation bilaterally with interbody bone graft. This is negative for solid bony fusion. Bone graft is present the left of midline    Pain Inventory Average Pain 7 Pain Right Now 7 My pain is intermittent, sharp, tingling and aching  In the last 24 hours, has pain interfered with the following? General activity 7 Relation with others 7 Enjoyment of life 7 What TIME of day is your pain at its worst? morning, night Sleep (in general) Poor  Pain is worse with: walking, bending, sitting and standing Pain improves with: medication and TENS Relief from Meds: 5  Mobility walk without assistance walk with assistance use a cane do you drive?  yes transfers alone  Function not employed: date last employed 1 1/2 years ago  Neuro/Psych weakness trouble walking  Prior Studies Any changes since last visit?  no  Physicians involved in your care Any changes since last visit?  no   Family History  Problem Relation Age of Onset  . Diabetes Mother   . Heart disease Mother   . Hypertension Mother   . Cancer Father    History   Social History  . Marital Status: Single    Spouse Name: N/A    Number of Children: N/A  . Years of Education: N/A    Social History Main Topics  . Smoking status: Current Every Day Smoker -- 1.00 packs/day    Types: Cigarettes  . Smokeless tobacco: None  . Alcohol Use: None  . Drug Use: None  . Sexual Activity: None   Other Topics Concern  . None   Social History Narrative  . None   Past Surgical History  Procedure Laterality Date  . Spine surgery    . Neck surgery     Past Medical History  Diagnosis Date  . Low back pain   . Arthritis    BP 145/82  Pulse 82  Resp 14  Ht 5\' 11"  (1.803 m)  Wt 148 lb (67.132 kg)  BMI 20.65 kg/m2  SpO2 99%  Opioid Risk Score:   Fall Risk Score: High Fall Risk (>13 points) (pt educated and given brochure on fall risk)   Review of Systems  Musculoskeletal: Positive for back pain, gait problem and neck pain.  Neurological: Positive for weakness.  All other systems reviewed and are negative.       Objective:   Physical Exam  Constitutional: He is oriented to person, place, and time. He appears well-developed and well-nourished.  HENT:  Head: Normocephalic and atraumatic.  Eyes: Conjunctivae and EOM are normal. Pupils are equal, round, and reactive to light.  Neck: Normal range of motion. Neck supple.  Cardiovascular: Normal rate and  regular rhythm.  Pulmonary/Chest: Effort normal and breath sounds normal.  Abdominal: Soft. Bowel sounds are normal.  Musculoskeletal:  Right shoulder: Pain: Straight leg raising was positive.   (Perhaps not quite as much as before) Lumbar back: He exhibits decreased range of motion, tenderness, bony tenderness, pain and spasm.  Patient remains extremely limited in range of motion at the lumbar spine. He can bend to about 90 in forward flexion and about 10 or 15 in other planes. He is still antalgic on the left leg with gait. He is uncomfortable sitting.  He has pain with palpation over the left low lumbar spine down to the PSIS area. Left lumbar paraspinals are extremely tight. Greater trochanter region slightly  tender.  Neurological: He is alert and oriented to person, place, and time.  His ankle dorsiflexion and plantar flexion weakness grossly 4/5 (IMPROVED). Hamstrings are 4 out of 5. There is diminished sensation of the L5 and S1 distributions  Once again. Reflexes are diminished at the left heel cord  Skin: Skin is warm.  Assessment & Plan:    1. Lumbar post laminectomy syndrome  2. Lumbar radiculopathy L5, (likely S1 involvement also) left with mild foot drop and substantial sensory deficits  3. 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      Plan:  1. Begin a re-trial of gabapentin titrating up to 300mg  tid 2. Can continue Flexeril to 10 mg tid prn for muscle spasms 3. Consider sleep aid pending response to the above.  4. UDS today, i will rx narcotics pending results.  .  5. I still  consider follow up with his surgeon. This patient has continued neurological findings on examination. I still feel that he needs a followup CT myelogram as well.  6. We'll see the patient back in about one month's time. He was asked to call with any problems or questions.

## 2013-04-09 ENCOUNTER — Telehealth: Payer: Self-pay

## 2013-04-09 NOTE — Telephone Encounter (Signed)
Patient requests something else for pain.  The gabapentin is not helping his back.  Urine drug screen not resulted yet.  Advised patient to call back in a couple days for request.  He is taking ibuprofen right now to help.  Suggested he take tylenol.

## 2013-04-09 NOTE — Telephone Encounter (Signed)
Patient is requesting medication for pain. Did not leave details regarding pain.

## 2013-04-13 ENCOUNTER — Other Ambulatory Visit: Payer: Self-pay

## 2013-04-13 ENCOUNTER — Telehealth: Payer: Self-pay

## 2013-04-13 NOTE — Progress Notes (Signed)
Urine drug screen from 04/03/2013 was consistent. 

## 2013-04-13 NOTE — Telephone Encounter (Signed)
May have hydrocodone 10/325 one q6 prn #90 to start

## 2013-04-13 NOTE — Telephone Encounter (Signed)
Patient called regarding his urine drug screen results.  He would like something for pain.  Urine drug screen was consistent.  Please advise.

## 2013-04-14 MED ORDER — HYDROCODONE-ACETAMINOPHEN 10-325 MG PO TABS: 1.0000 | ORAL_TABLET | Freq: Four times a day (QID) | ORAL | Status: DC | PRN

## 2013-04-14 NOTE — Telephone Encounter (Signed)
Hydrocodone rx printed to be signed.  Will call patient when ready.  He needs to sign a controlled substance agreement.

## 2013-04-14 NOTE — Telephone Encounter (Signed)
Left message advising patient he can pick up his hydrocodone rx.  He will also sign a controlled substance agreement.

## 2013-04-29 ENCOUNTER — Encounter: Payer: Self-pay | Admitting: Registered Nurse

## 2013-04-29 ENCOUNTER — Encounter: Payer: Self-pay | Attending: Physical Medicine & Rehabilitation | Admitting: Registered Nurse

## 2013-04-29 VITALS — BP 128/77 | HR 74 | Resp 14 | Ht 71.0 in | Wt 147.8 lb

## 2013-04-29 DIAGNOSIS — M5416 Radiculopathy, lumbar region: Secondary | ICD-10-CM

## 2013-04-29 DIAGNOSIS — M5030 Other cervical disc degeneration, unspecified cervical region: Secondary | ICD-10-CM | POA: Insufficient documentation

## 2013-04-29 DIAGNOSIS — M961 Postlaminectomy syndrome, not elsewhere classified: Secondary | ICD-10-CM | POA: Insufficient documentation

## 2013-04-29 DIAGNOSIS — IMO0002 Reserved for concepts with insufficient information to code with codable children: Secondary | ICD-10-CM | POA: Insufficient documentation

## 2013-04-29 MED ORDER — HYDROCODONE-ACETAMINOPHEN 10-325 MG PO TABS: 1.0000 | ORAL_TABLET | Freq: Four times a day (QID) | ORAL | Status: AC | PRN

## 2013-04-29 MED ORDER — GABAPENTIN 300 MG PO CAPS: 300.0000 mg | ORAL_CAPSULE | Freq: Three times a day (TID) | ORAL | Status: AC

## 2013-04-29 NOTE — Patient Instructions (Signed)
Increase Gabapentin  to two capsules three times a day. If it's affordable for you.  Try the Norco for two weeks if no relief call the office.  Follow up with your Surgeon due to the intensity of your pain.

## 2013-04-29 NOTE — Progress Notes (Signed)
Subjective:    Patient ID: Gabriel Bates, male    DOB: 1965/12/05, 48 y.o.   MRN: [id number]  HPI:[name] is a 48 year old male who returns for follow up for chronic pain and medication refill. His pain is in his lower back, and left  left leg. He rates his pain 6. He uses heat and ice therapy which offers minimalrelief. The TENS unit helps at times.  Facial grimacing noted, wide gait to compensate left leg pain. Antalgic gate. His current exercise regime has been walking. He hasn't been able to exercise due to intensity of pain. He's living a sedentary lifestyle. He says the pain has caused him to be depressed. He talks to his mother and he says she has been his rock. I suggested counseling or pastoral counseling. He has denied anti-depressant at this time. Also, spoke to him getting a follow up appointment with Dr. Jeral Bates. He verbalizes understanding.  Pain Inventory Average Pain 7 Pain Right Now 6 My pain is sharp, burning, tingling and aching  In the last 24 hours, has pain interfered with the following? General activity 8 Relation with others 8 Enjoyment of life 8 What TIME of day is your pain at its worst? morning and night Sleep (in general) Fair  Pain is worse with: walking, bending, sitting and standing Pain improves with: rest, medication and TENS Relief from Meds: 4  Mobility walk without assistance use a cane  Function not employed: date last employed .  Neuro/Psych weakness numbness tingling trouble walking  Prior Studies Any changes since last visit?  no  Physicians involved in your care Any changes since last visit?  no   Family History  Problem Relation Age of Onset  . Diabetes Mother   . Heart disease Mother   . Hypertension Mother   . Cancer Father    History   Social History  . Marital Status: Single    Spouse Name: N/A    Number of Children: N/A  . Years of Education: N/A   Social History Main Topics  . Smoking status: Current  Every Day Smoker -- 1.00 packs/day    Types: Cigarettes  . Smokeless tobacco: None  . Alcohol Use: None  . Drug Use: None  . Sexual Activity: None   Other Topics Concern  . None   Social History Narrative  . None   Past Surgical History  Procedure Laterality Date  . Spine surgery    . Neck surgery     Past Medical History  Diagnosis Date  . Low back pain   . Arthritis    BP 128/77  Pulse 74  Resp 14  Ht 5\' 11"  (1.803 m)  Wt 147 lb 12.8 oz (67.042 kg)  BMI 20.62 kg/m2  SpO2 99%  Opioid Risk Score:   Fall Risk Score: Moderate Fall Risk (6-13 points) (educated and handout on fall prevention given at previous visit)  Review of Systems  Musculoskeletal: Positive for gait problem.  Neurological: Positive for weakness and numbness.       Tingling  All other systems reviewed and are negative.      Objective:   Physical Exam  Nursing note and vitals reviewed. Constitutional: He is oriented to person, place, and time. He appears well-developed and well-nourished.  HENT:  Head: Normocephalic.  Neck: Normal range of motion. Neck supple.  Cardiovascular: Normal rate, regular rhythm and normal heart sounds.   Pulmonary/Chest: Effort normal and breath sounds normal.  Musculoskeletal:  Normal Muscle Bulk : Muscle testing Reveals: Hand grips 5/5 Lumbar Paraspinals tenderness Right leg flexion with tightness in leg. ROM:WNL Left Leg decreased flexion 20 degrees. Pain with any movement to leg radiating to back.  Neurological: He is alert and oriented to person, place, and time.  Skin: Skin is warm and dry.  Psychiatric:  Affect Flat: Depressed          Assessment & Plan:   1. Lumbar post laminectomy syndrome : Increased Gabapentin to 600 mg TID,  Refilled: Norco 10/325 mg #120 one tablet every 6 hours as needed. 2. Lumbar radiculopathy L5, (likely S1 involvement also) left with mild foot drop and substantial sensory deficits : Instructed to call Dr. Jeral Bates.  Continue flexeril 3. Depression: Denied anti-depressant at this time. Encourage Counseling.

## 2013-04-30 ENCOUNTER — Telehealth: Payer: Self-pay

## 2013-04-30 NOTE — Telephone Encounter (Signed)
I called [facility] and yes the MS Contin 30 mg every 12 hours #60 the cost of 179.99 was the generic.

## 2013-04-30 NOTE — Telephone Encounter (Signed)
Riley LamEunice NP called [facility] to price ms contin 30mg  every 12 hours #60.  Per pharmacy cost was $179.99.

## 2013-05-08 ENCOUNTER — Telehealth: Payer: Self-pay | Admitting: *Deleted

## 2013-05-08 NOTE — Telephone Encounter (Signed)
Gabriel Bates has called x 2 to report that his medication (hydrocodone) he was given at last visit with Jacalyn LefevreEunice Thomas NP is not helping and he wouldlike to be changed to something else.  Please advise

## 2013-05-08 NOTE — Telephone Encounter (Signed)
I talked with Gabriel Bates and he wants to change medication.  I told him that the pharmacy we called ([facility] MS Contin 30 mg #60) would be 179.99.  I told him he could check around with pharmacies in his area. He will come by Monday and bring his medication to exchange for a new rx.

## 2013-05-27 ENCOUNTER — Encounter: Payer: Self-pay | Attending: Physical Medicine & Rehabilitation | Admitting: Registered Nurse

## 2013-05-27 DIAGNOSIS — M961 Postlaminectomy syndrome, not elsewhere classified: Secondary | ICD-10-CM | POA: Insufficient documentation

## 2013-05-27 DIAGNOSIS — M5030 Other cervical disc degeneration, unspecified cervical region: Secondary | ICD-10-CM | POA: Insufficient documentation

## 2013-05-27 DIAGNOSIS — IMO0002 Reserved for concepts with insufficient information to code with codable children: Secondary | ICD-10-CM | POA: Insufficient documentation

## 2020-04-12 ENCOUNTER — Other Ambulatory Visit: Payer: Self-pay | Admitting: Orthopedic Surgery

## 2020-04-12 DIAGNOSIS — M25512 Pain in left shoulder: Secondary | ICD-10-CM

## 2020-04-17 ENCOUNTER — Ambulatory Visit
Admission: RE | Admit: 2020-04-17 | Discharge: 2020-04-17 | Disposition: A | Discharge status: Home / Self Care | Payer: Self-pay | Source: Ambulatory Visit | Attending: Orthopedic Surgery | Admitting: Orthopedic Surgery

## 2020-04-17 ENCOUNTER — Other Ambulatory Visit: Payer: Self-pay

## 2020-04-17 DIAGNOSIS — M25512 Pain in left shoulder: Secondary | ICD-10-CM

## 2021-11-16 IMAGING — MR MR SHOULDER*L* W/O CM
4 of 5 series · 21 of 40 positions shown · non-contrast
Comparison: None.

CLINICAL DATA: Left shoulder pain with limited range of motion
since falling at work 2 months ago.

EXAM:
MRI OF THE LEFT SHOULDER WITHOUT CONTRAST
TECHNIQUE: Multiplanar, multisequence MR imaging of the shoulder was performed.
No intravenous contrast was administered.

[Series 6: T2 fat-sat · axial · left · 3.0mm · 0.47mm/px · z∈[-30,+72]mm · 8 of 27 slices shown (1 of 3)]
[im 1/27]
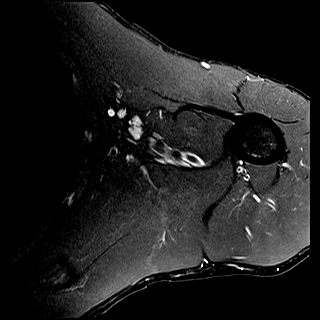
[im 3/27]
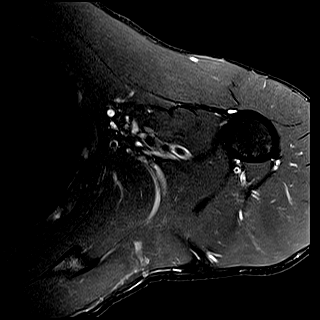
[im 9/27]
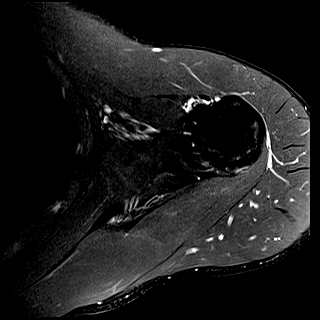
[im 12/27]
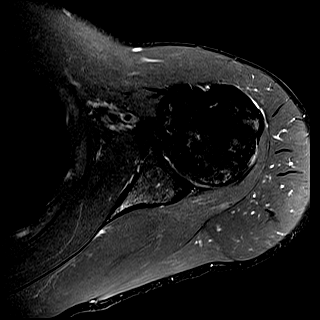
[im 15/27]
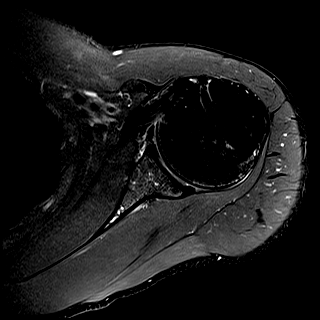
[im 18/27]
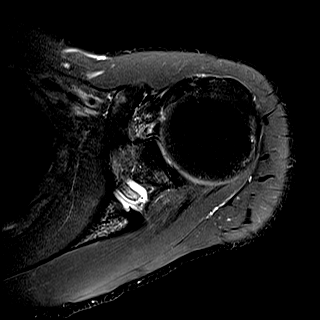
[im 24/27]
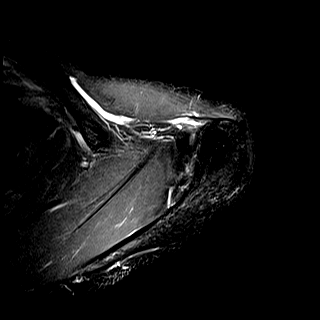
[im 27/27]
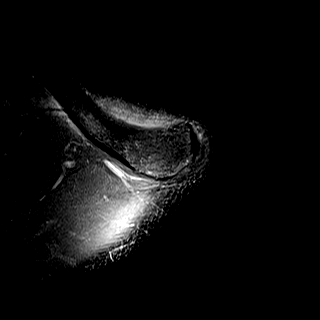

[Series 7: T2 fat-sat · oblique · left · 4.0mm · 0.22mm/px · 3 of 21 slices shown (2 of 3)]
[im 4/21]
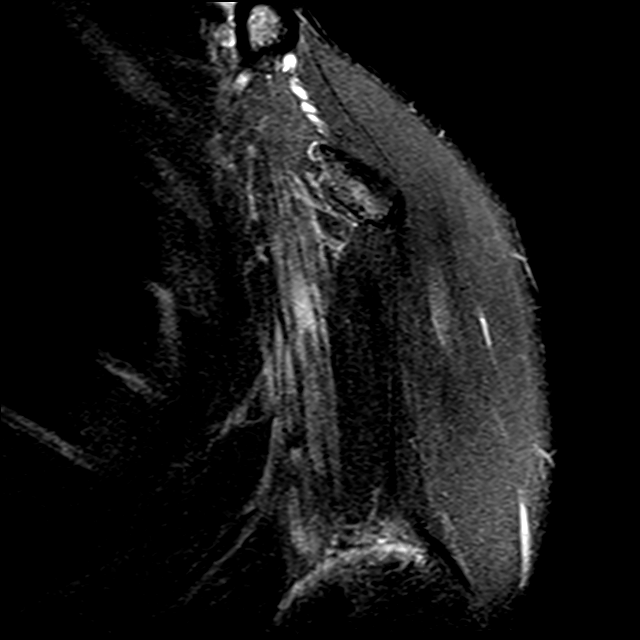
[im 11/21]
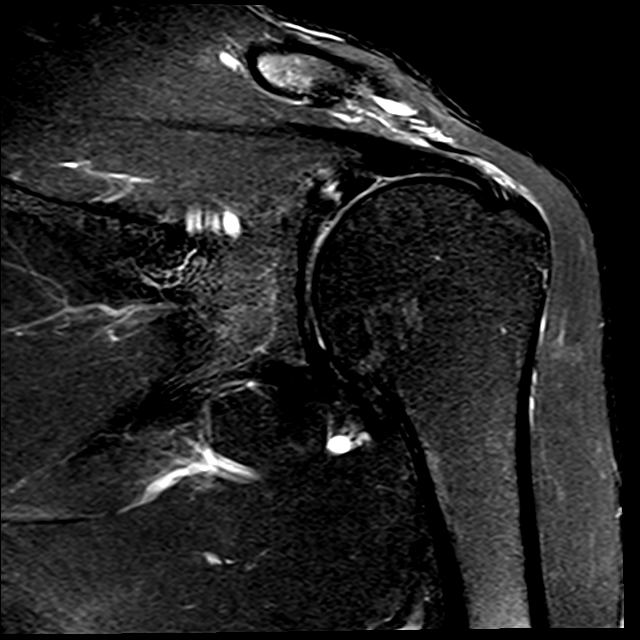
[im 17/21]
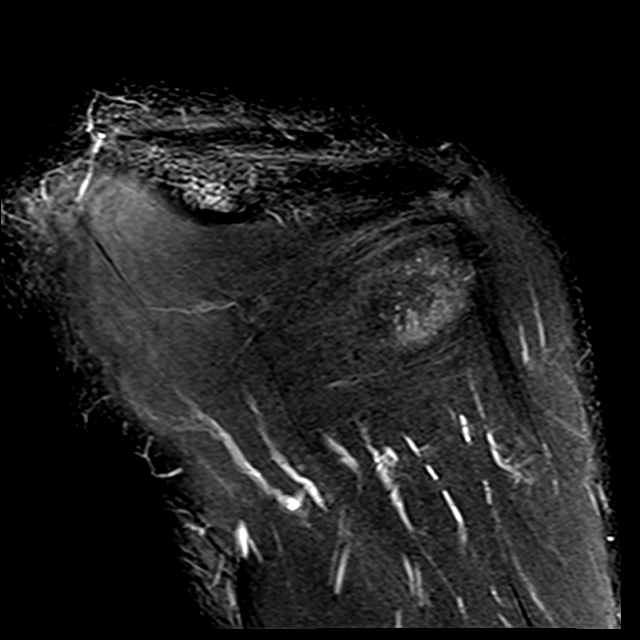

[Series 8: PD · oblique · left · 4.0mm · 0.22mm/px · 7 of 21 slices shown]
[im 1/21]
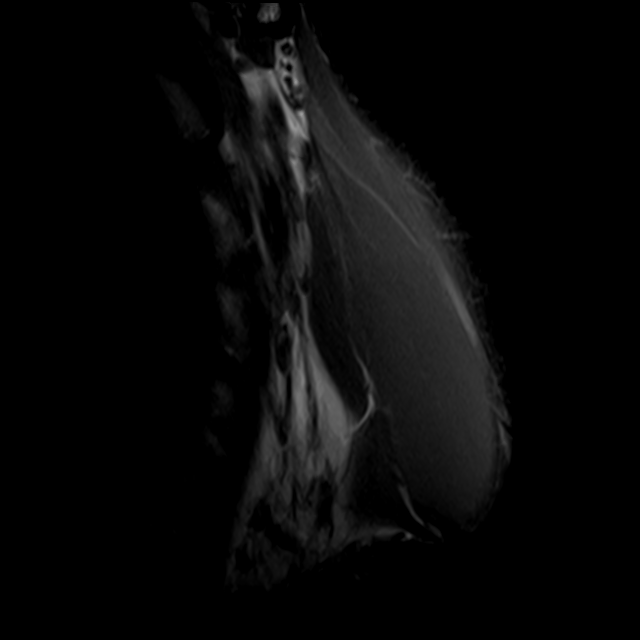
[im 4/21]
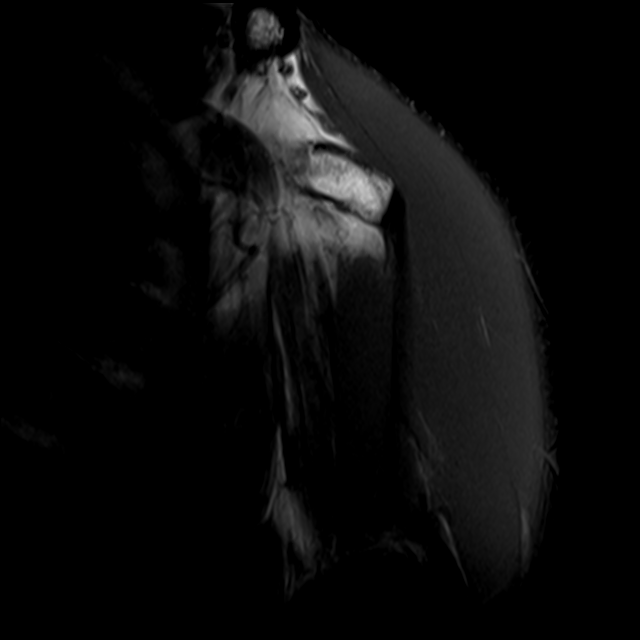
[im 7/21]
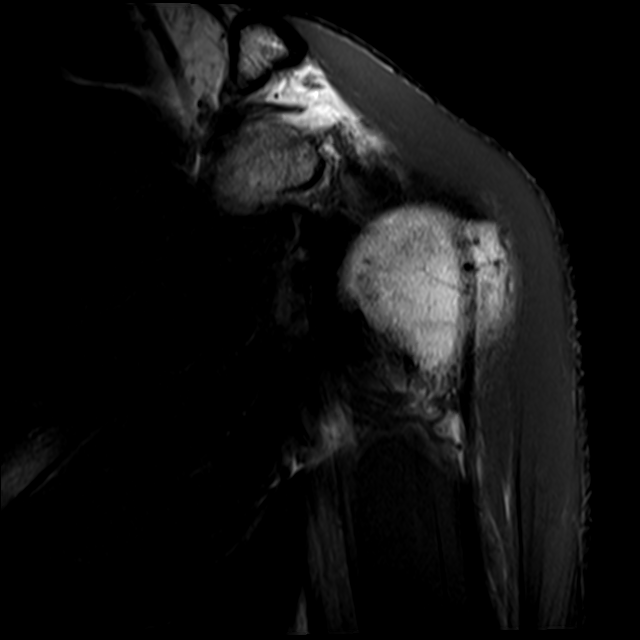
[im 11/21]
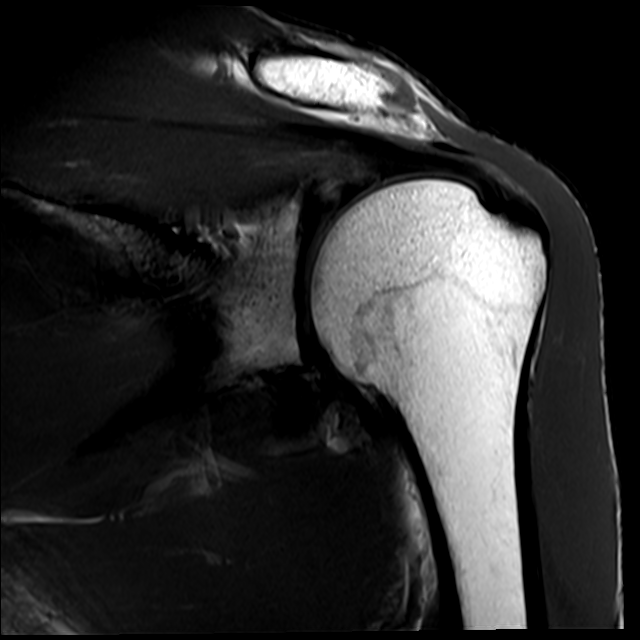
[im 14/21]
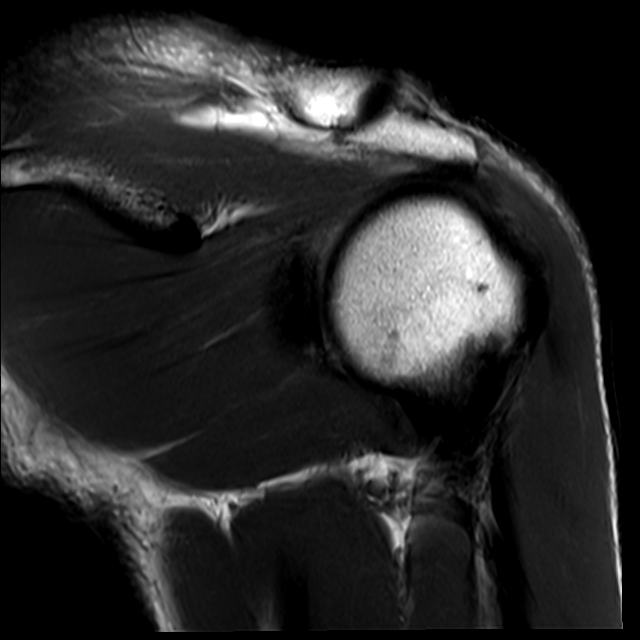
[im 17/21]
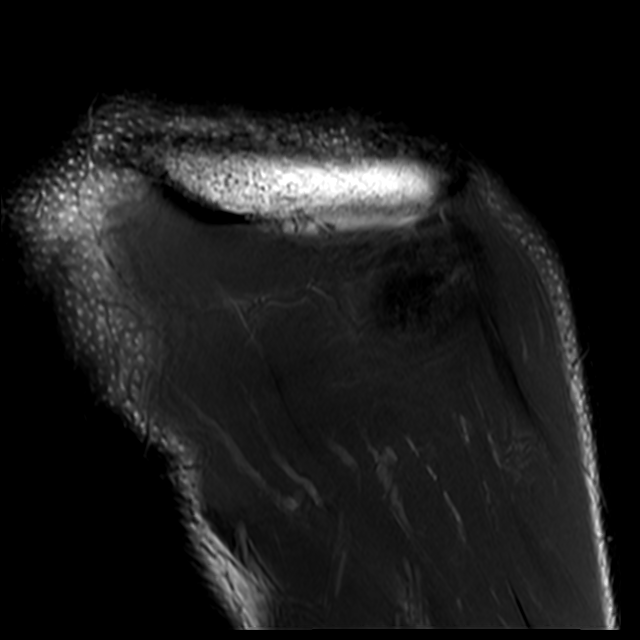
[im 21/21]
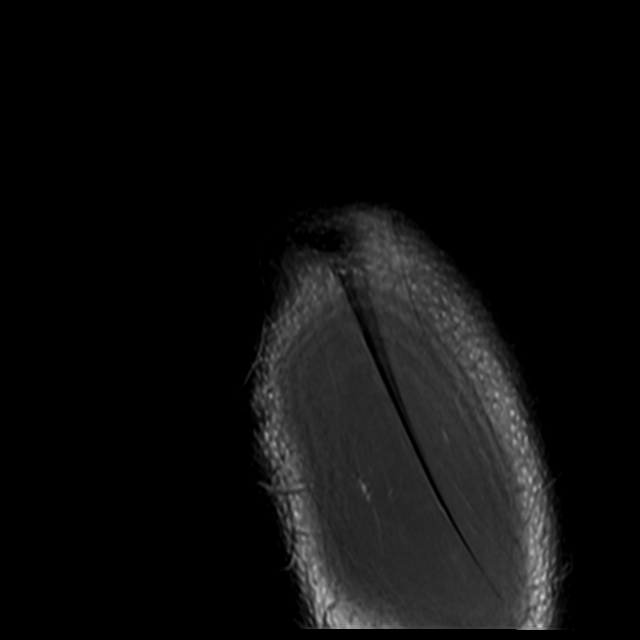

[Series 9: T2 fat-sat · oblique · left · 4.0mm · 0.44mm/px · 3 of 23 slices shown (3 of 3)]
[im 4/23]
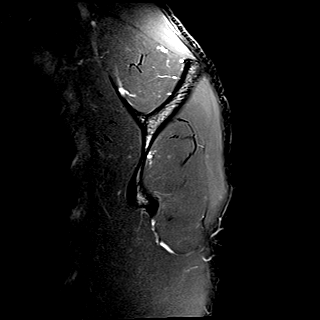
[im 13/23]
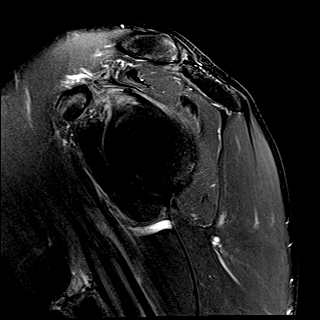
[im 19/23]
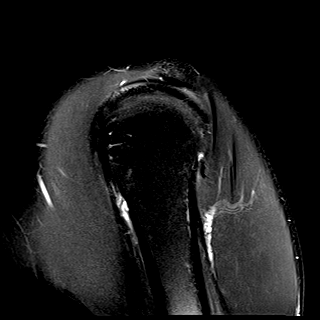

[21 of 40 positions shown; findings below may reference images not displayed]

FINDINGS: Rotator cuff:  Intact without significant tendinosis.

Muscles:  No focal muscular atrophy or edema.

Biceps long head: There is signal within the intra-articular portion
of the biceps tendon consistent with tendinosis, best seen on the
sagittal images. The tendon is normally located distally in the
bicipital groove.

Acromioclavicular Joint: The acromion is type 1. There are minimal
acromioclavicular degenerative changes. No significant fluid is
present in the subacromial - subdeltoid bursa.

Glenohumeral Joint: No significant shoulder joint effusion or
glenohumeral arthropathy.

Labrum: Labral assessment is limited by the lack of joint fluid. On
the axial images, the anterior superior labrum appears loosely
attached to the glenoid, likely reflecting a normal variant.
However, linear T2 hyperintensity extends posterior to the biceps
origin on the coronal images. There is no paralabral cyst. The
inferior labrum appears normal.

Bones: No acute or significant extra-articular osseous findings.

Other: No significant soft tissue findings.
IMPRESSION: 1. Tendinosis of the intra-articular portion of the biceps tendon.
2. Signal in the superior labrum extends posterior to the biceps
origin and could reflect a nondisplaced labral tear. Labral
assessment is limited by the lack of joint fluid. MR arthrography
could be performed for further evaluation if clinically warranted.
3. Intact rotator cuff.
